# Patient Record
Sex: Male | Born: 2000 | Race: Black or African American | Hispanic: No | Marital: Single
Health system: Southern US, Community
[De-identification: ages and names within clinical notes are randomized; demographics above are authoritative.]

---
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
## 2022-03-08 DIAGNOSIS — Z419 Encounter for procedure for purposes other than remedying health state, unspecified: Secondary | ICD-10-CM | POA: Diagnosis not present

## 2022-04-07 DIAGNOSIS — Z419 Encounter for procedure for purposes other than remedying health state, unspecified: Secondary | ICD-10-CM | POA: Diagnosis not present

## 2022-05-08 DIAGNOSIS — Z419 Encounter for procedure for purposes other than remedying health state, unspecified: Secondary | ICD-10-CM | POA: Diagnosis not present

## 2022-06-08 DIAGNOSIS — Z419 Encounter for procedure for purposes other than remedying health state, unspecified: Secondary | ICD-10-CM | POA: Diagnosis not present

## 2022-07-07 DIAGNOSIS — Z419 Encounter for procedure for purposes other than remedying health state, unspecified: Secondary | ICD-10-CM | POA: Diagnosis not present

## 2022-08-07 DIAGNOSIS — Z419 Encounter for procedure for purposes other than remedying health state, unspecified: Secondary | ICD-10-CM | POA: Diagnosis not present

## 2022-09-06 DIAGNOSIS — Z419 Encounter for procedure for purposes other than remedying health state, unspecified: Secondary | ICD-10-CM | POA: Diagnosis not present

## 2022-09-14 ENCOUNTER — Telehealth: Payer: Self-pay

## 2022-09-14 NOTE — Telephone Encounter (Signed)
LVM for patient to call back. AS< CMA 

## 2022-10-07 DIAGNOSIS — Z419 Encounter for procedure for purposes other than remedying health state, unspecified: Secondary | ICD-10-CM | POA: Diagnosis not present

## 2022-11-06 DIAGNOSIS — Z419 Encounter for procedure for purposes other than remedying health state, unspecified: Secondary | ICD-10-CM | POA: Diagnosis not present

## 2022-12-07 DIAGNOSIS — Z419 Encounter for procedure for purposes other than remedying health state, unspecified: Secondary | ICD-10-CM | POA: Diagnosis not present

## 2022-12-17 IMAGING — CR DG ANKLE COMPLETE 3+V*L*
3 series · 3 of 3 positions shown · non-contrast
Comparison: None.

CLINICAL DATA: Left ankle pain after twisting injury. Injury
playing basketball.

EXAM:
LEFT ANKLE COMPLETE - 3+ VIEW

[ankle ap]
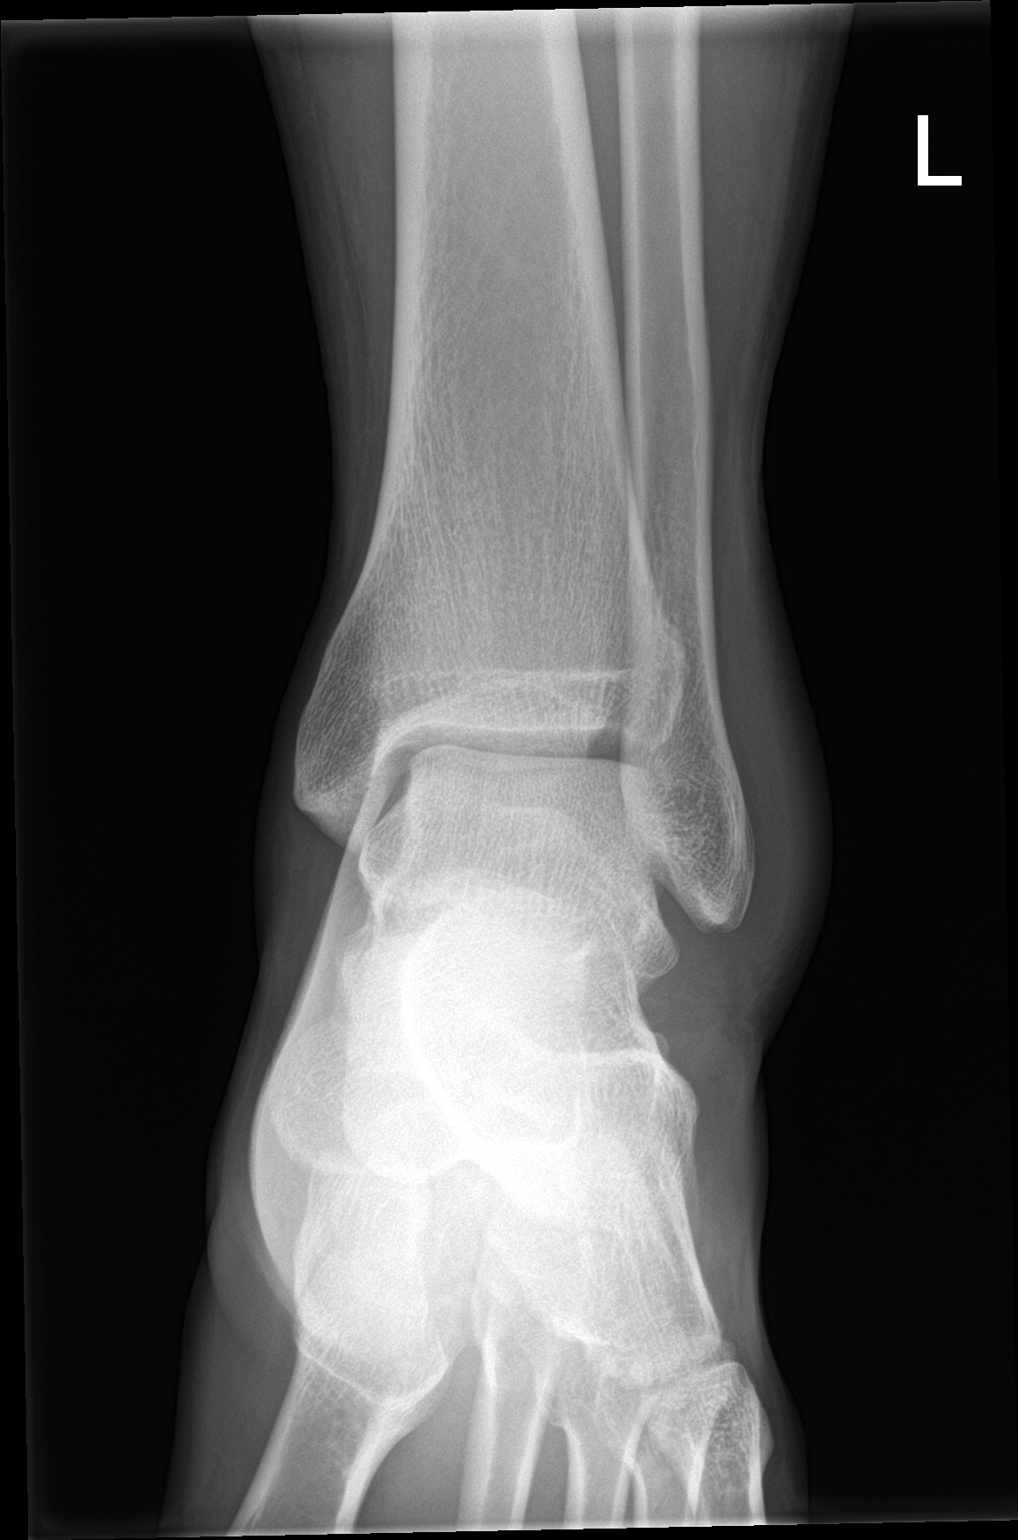

[ankle obl]
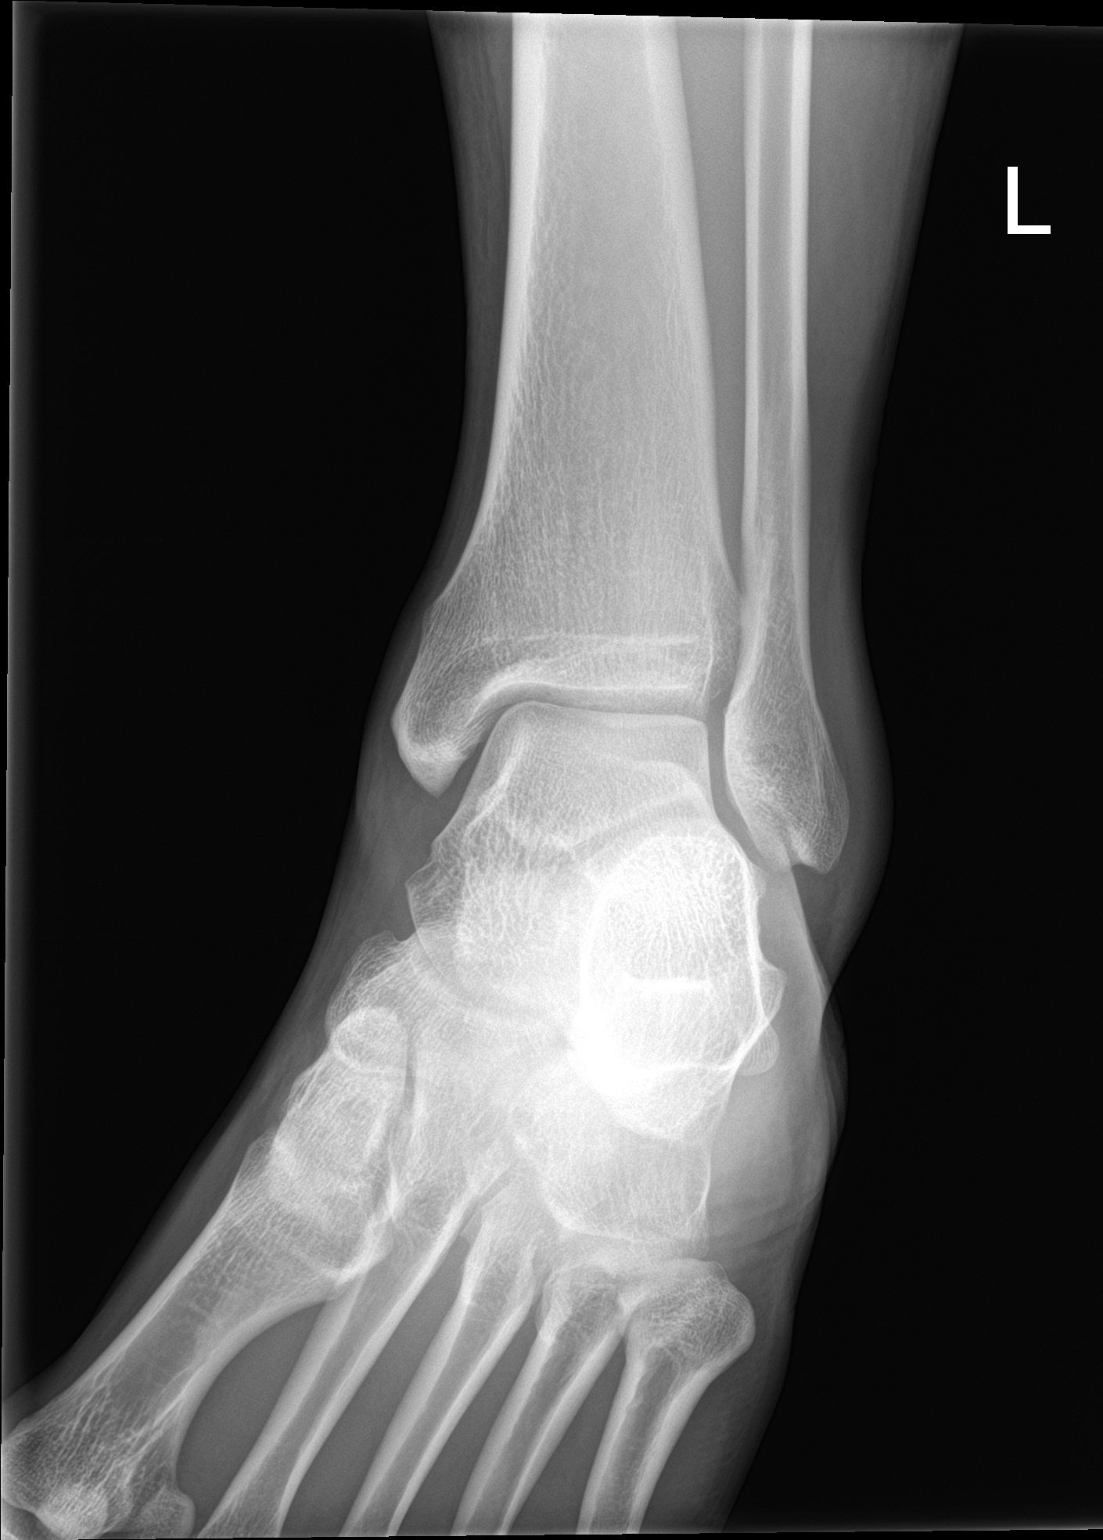

[ankle lat]
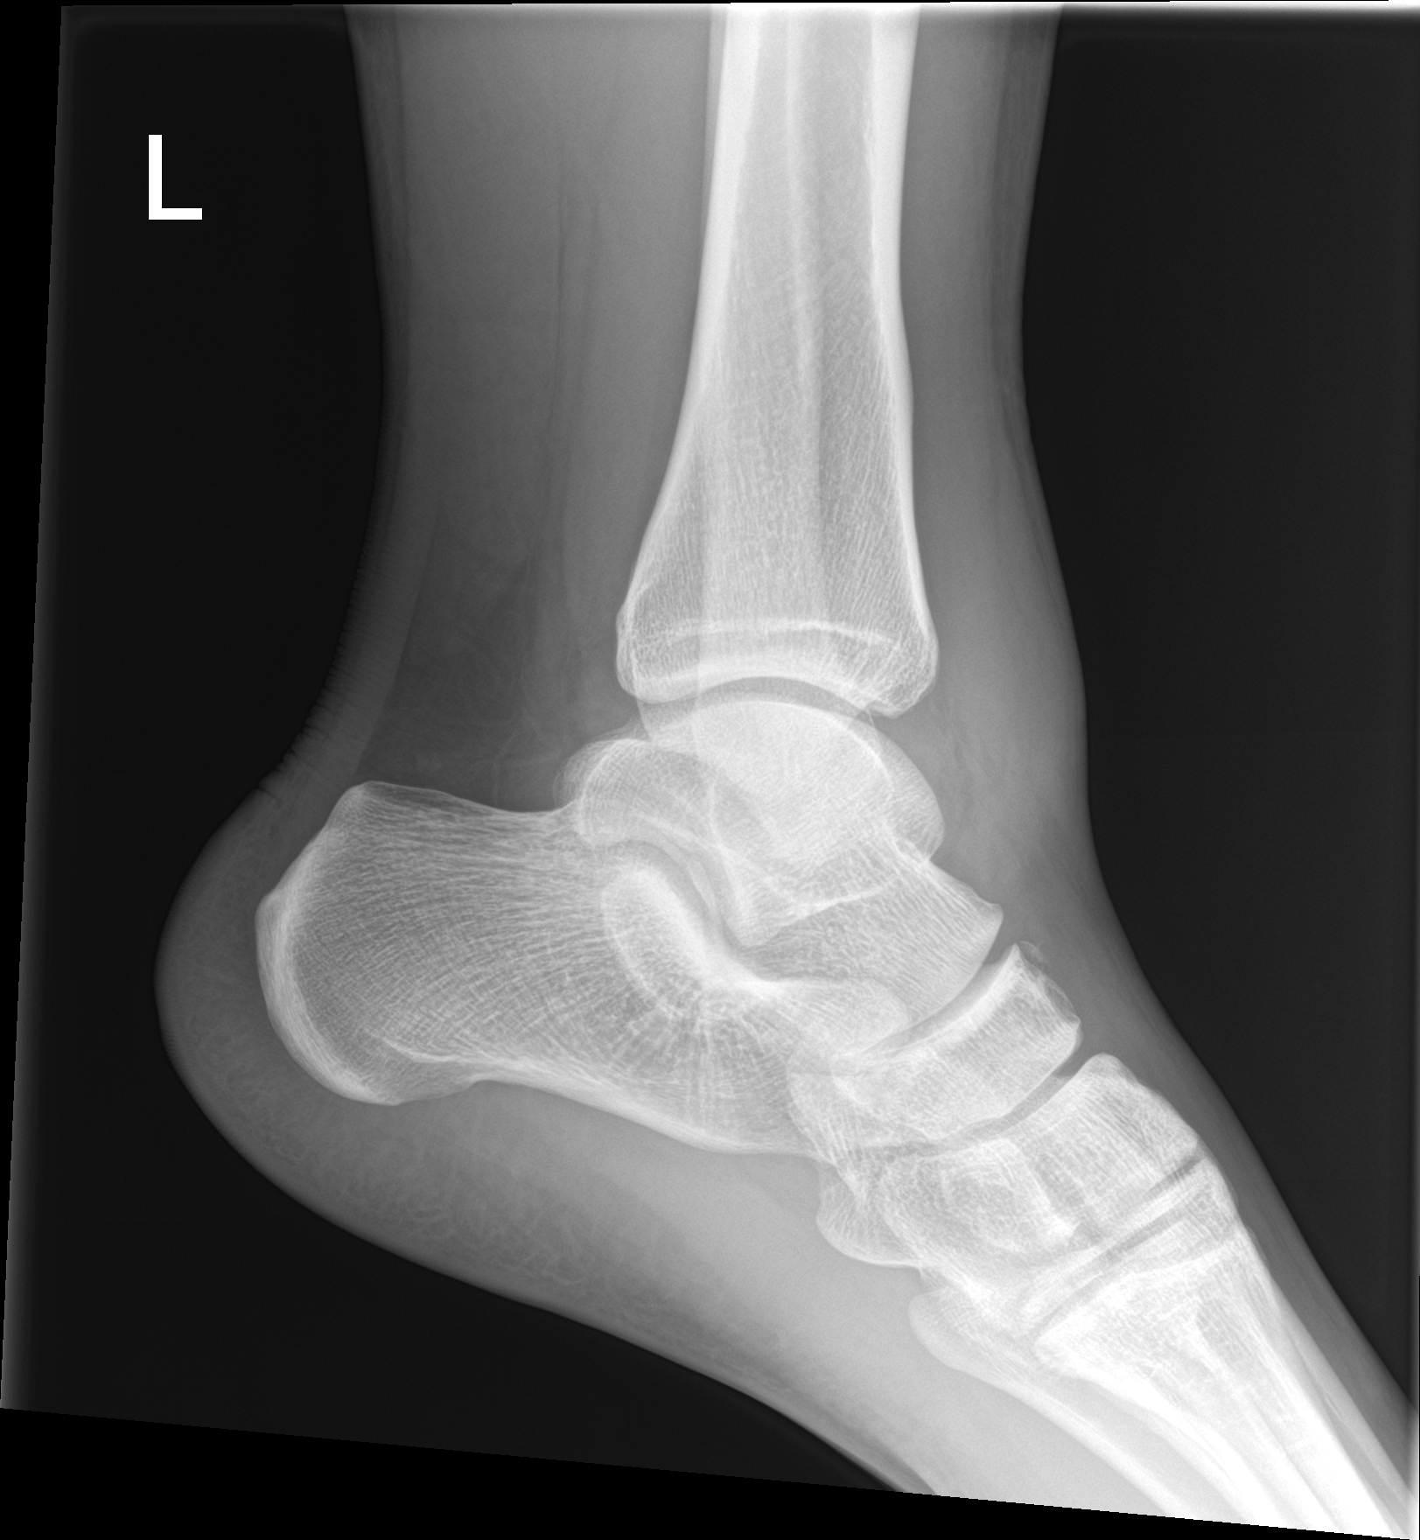

[3 of 3 positions shown; findings below may reference images not displayed]

FINDINGS: Small acute avulsion fracture from the dorsal talus. No other acute
fracture. Ankle mortise is preserved. Normal alignment. Anterior and
lateral soft tissue edema.
IMPRESSION: Small acute avulsion fracture from the dorsal talus.

## 2023-01-07 DIAGNOSIS — Z419 Encounter for procedure for purposes other than remedying health state, unspecified: Secondary | ICD-10-CM | POA: Diagnosis not present

## 2023-02-06 DIAGNOSIS — Z419 Encounter for procedure for purposes other than remedying health state, unspecified: Secondary | ICD-10-CM | POA: Diagnosis not present

## 2023-03-09 DIAGNOSIS — Z419 Encounter for procedure for purposes other than remedying health state, unspecified: Secondary | ICD-10-CM | POA: Diagnosis not present

## 2023-04-08 DIAGNOSIS — Z419 Encounter for procedure for purposes other than remedying health state, unspecified: Secondary | ICD-10-CM | POA: Diagnosis not present
